# Patient Record
Sex: Female | Born: 1995 | Race: Black or African American | Hispanic: No | Marital: Single | Smoking: Never smoker
Health system: Southern US, Community
[De-identification: ages and names within clinical notes are randomized; demographics above are authoritative.]

## PROBLEM LIST (undated history)

## (undated) DIAGNOSIS — J45909 Unspecified asthma, uncomplicated: Secondary | ICD-10-CM

## (undated) DIAGNOSIS — K589 Irritable bowel syndrome without diarrhea: Secondary | ICD-10-CM

## (undated) HISTORY — PX: CHOLECYSTECTOMY: SHX55

---

## 2015-12-12 ENCOUNTER — Emergency Department (HOSPITAL_COMMUNITY)
Admission: EM | Admit: 2015-12-12 | Discharge: 2015-12-12 | Disposition: A | Discharge status: Home / Self Care | Payer: BLUE CROSS/BLUE SHIELD | Attending: Emergency Medicine | Admitting: Emergency Medicine

## 2015-12-12 ENCOUNTER — Emergency Department (HOSPITAL_COMMUNITY): Payer: BLUE CROSS/BLUE SHIELD

## 2015-12-12 ENCOUNTER — Encounter (HOSPITAL_COMMUNITY): Payer: Self-pay | Admitting: *Deleted

## 2015-12-12 DIAGNOSIS — J45901 Unspecified asthma with (acute) exacerbation: Secondary | ICD-10-CM | POA: Diagnosis not present

## 2015-12-12 DIAGNOSIS — Z3202 Encounter for pregnancy test, result negative: Secondary | ICD-10-CM | POA: Insufficient documentation

## 2015-12-12 DIAGNOSIS — R197 Diarrhea, unspecified: Secondary | ICD-10-CM | POA: Insufficient documentation

## 2015-12-12 DIAGNOSIS — R109 Unspecified abdominal pain: Secondary | ICD-10-CM | POA: Insufficient documentation

## 2015-12-12 DIAGNOSIS — R42 Dizziness and giddiness: Secondary | ICD-10-CM | POA: Insufficient documentation

## 2015-12-12 DIAGNOSIS — R6889 Other general symptoms and signs: Secondary | ICD-10-CM

## 2015-12-12 DIAGNOSIS — Z880 Allergy status to penicillin: Secondary | ICD-10-CM | POA: Diagnosis not present

## 2015-12-12 DIAGNOSIS — R112 Nausea with vomiting, unspecified: Secondary | ICD-10-CM | POA: Insufficient documentation

## 2015-12-12 HISTORY — DX: Unspecified asthma, uncomplicated: J45.909

## 2015-12-12 LAB — URINALYSIS, ROUTINE W REFLEX MICROSCOPIC
Bilirubin Urine: NEGATIVE
Glucose, UA: NEGATIVE mg/dL
Hgb urine dipstick: NEGATIVE
KETONES UR: NEGATIVE mg/dL
LEUKOCYTES UA: NEGATIVE
NITRITE: NEGATIVE
PH: 7 (ref 5.0–8.0)
Protein, ur: NEGATIVE mg/dL
Specific Gravity, Urine: 1.018 (ref 1.005–1.030)

## 2015-12-12 LAB — CBC
HEMATOCRIT: 42.2 % (ref 36.0–46.0)
HEMOGLOBIN: 13.6 g/dL (ref 12.0–15.0)
MCH: 27 pg (ref 26.0–34.0)
MCHC: 32.2 g/dL (ref 30.0–36.0)
MCV: 83.9 fL (ref 78.0–100.0)
Platelets: 326 10*3/uL (ref 150–400)
RBC: 5.03 MIL/uL (ref 3.87–5.11)
RDW: 15 % (ref 11.5–15.5)
WBC: 8.3 10*3/uL (ref 4.0–10.5)

## 2015-12-12 LAB — COMPREHENSIVE METABOLIC PANEL
ALT: 28 U/L (ref 14–54)
ANION GAP: 9 (ref 5–15)
AST: 27 U/L (ref 15–41)
Albumin: 4.1 g/dL (ref 3.5–5.0)
Alkaline Phosphatase: 77 U/L (ref 38–126)
BILIRUBIN TOTAL: 0.4 mg/dL (ref 0.3–1.2)
BUN: 8 mg/dL (ref 6–20)
CO2: 25 mmol/L (ref 22–32)
Calcium: 9.1 mg/dL (ref 8.9–10.3)
Chloride: 103 mmol/L (ref 101–111)
Creatinine, Ser: 0.78 mg/dL (ref 0.44–1.00)
GFR calc Af Amer: >60 mL/min (ref >60)
Glucose, Bld: 80 mg/dL (ref 65–99)
POTASSIUM: 4.2 mmol/L (ref 3.5–5.1)
Sodium: 137 mmol/L (ref 135–145)
TOTAL PROTEIN: 7.7 g/dL (ref 6.5–8.1)

## 2015-12-12 LAB — LIPASE, BLOOD: Lipase: 22 U/L (ref 11–51)

## 2015-12-12 LAB — PREGNANCY, URINE: PREG TEST UR: NEGATIVE

## 2015-12-12 MED ORDER — DIPHENOXYLATE-ATROPINE 2.5-0.025 MG PO TABS: 1.0000 | ORAL_TABLET | Freq: Four times a day (QID) | ORAL | Status: AC | PRN

## 2015-12-12 MED ORDER — DIPHENOXYLATE-ATROPINE 2.5-0.025 MG PO TABS
2.0000 | ORAL_TABLET | Freq: Once | ORAL | Status: AC
  Administered 2015-12-12: 2 via ORAL
  Filled 2015-12-12: qty 2

## 2015-12-12 MED ORDER — IBUPROFEN 200 MG PO TABS
400.0000 mg | ORAL_TABLET | Freq: Once | ORAL | Status: AC
  Administered 2015-12-12: 400 mg via ORAL
  Filled 2015-12-12: qty 2

## 2015-12-12 NOTE — ED Notes (Signed)
Pt tolerating PO intake. No complaints of nausea, abdominal pain or diarrhea. Pt states that she does feel her stomach "rumbling".

## 2015-12-12 NOTE — ED Notes (Signed)
Pt reports all week, she has been having body aches, chills, sore throat, and every times she eats she has diarrhea.  Pt reports she started to have vomiting today.

## 2015-12-13 NOTE — ED Provider Notes (Signed)
CSN: [id number]     Arrival date & time 12/12/15  1334 History   First MD Initiated Contact with Patient 12/12/15 1607     Chief Complaint  Patient presents with  . Generalized Body Aches  . Dizziness     (Consider location/radiation/quality/duration/timing/severity/associated sxs/prior Treatment) The history is provided by the patient.   Rachel Middleton is a 20 y.o. female with a past medical history significant for asthma, presenting with a 5 day history of persistent diarrhea, describing brown liquid stool triggered by any attempt at eating with 4 episodes occuring today.  She denies bloody stool and is able to tolerate sips of liquid intake without triggering diarrhea. She endorses nausea with one episode of vomiting this am along with body aches, chills and sore throat.  She reports cough with wheeze but has been controlled using her albuterol mdi, denies shortness of breath or coryza type symptoms. Denies any contacts with similar symptoms and no foreign travel.  She has abdominal cramping which improves with bm. Has had no treatment prior to arrival.    Past Medical History  Diagnosis Date  . Asthma    History reviewed. No pertinent past surgical history. No family history on file. Social History  Substance Use Topics  . Smoking status: Never Smoker   . Smokeless tobacco: None  . Alcohol Use: No   OB History    No data available     Review of Systems  Constitutional: Negative for fever and fatigue.  HENT: Negative for congestion and sore throat.   Eyes: Negative.   Respiratory: Negative for chest tightness and shortness of breath.   Cardiovascular: Negative for chest pain.  Gastrointestinal: Positive for nausea, vomiting, abdominal pain and diarrhea.  Genitourinary: Negative.   Musculoskeletal: Negative for joint swelling, arthralgias and neck pain.  Skin: Negative.  Negative for rash and wound.  Neurological: Negative for dizziness, weakness, light-headedness,  numbness and headaches.  Psychiatric/Behavioral: Negative.       Allergies  Penicillins  Home Medications   Prior to Admission medications   Medication Sig Start Date End Date Taking? Authorizing Provider  diphenoxylate-atropine (LOMOTIL) 2.5-0.025 MG tablet Take 1 tablet by mouth 4 (four) times daily as needed for diarrhea or loose stools. 12/12/15   Burgess Amor, PA-C   BP 115/72 mmHg  Pulse 69  Temp(Src) 98.2 F (36.8 C) (Oral)  Resp 15  SpO2 100% Physical Exam  Constitutional: She appears well-developed and well-nourished.  HENT:  Head: Normocephalic and atraumatic.  Eyes: Conjunctivae are normal.  Neck: Normal range of motion.  Cardiovascular: Normal rate, regular rhythm, normal heart sounds and intact distal pulses.   Pulmonary/Chest: Effort normal. She has wheezes in the right lower field and the left lower field.  Wheezing which clears with cough.  Abdominal: Soft. She exhibits no distension and no mass. Bowel sounds are increased. There is no tenderness. There is no rigidity, no rebound, no guarding, no tenderness at McBurney's point and negative Murphy's sign.  Musculoskeletal: Normal range of motion.  Neurological: She is alert.  Skin: Skin is warm and dry.  Psychiatric: She has a normal mood and affect.  Nursing note and vitals reviewed.   ED Course  Procedures (including critical care time) Labs Review Labs Reviewed  LIPASE, BLOOD  COMPREHENSIVE METABOLIC PANEL  CBC  URINALYSIS, ROUTINE W REFLEX MICROSCOPIC (NOT AT [facility])  PREGNANCY, URINE    Imaging Review Dg Chest 2 View  12/12/2015  CLINICAL DATA:  Productive cough and fever for 2 weeks. EXAM:  CHEST  2 VIEW COMPARISON:  None. FINDINGS: The cardiac silhouette, mediastinal and hilar contours are normal. There is mild peribronchial thickening which could suggest bronchitis. No infiltrates or effusions. The bony thorax is intact. IMPRESSION: Mild peribronchial thickening which could suggest bronchitis. No  infiltrates or effusions. Electronically Signed   By: Rudie MeyerP.  Gallerani M.D.   On: 12/12/2015 17:13   I have personally reviewed and evaluated these images and lab results as part of my medical decision-making.   EKG Interpretation None      MDM   Final diagnoses:  Flu-like symptoms  Diarrhea, unspecified type    Benign abdominal exam, initially and at recheck prior to dc home. Labs and imaging reviewed. Pt given lomotil while here, and was then able to tolerate po intake without triggering diarrhea. Prescribed same for prn use. B.r.a.t diet, continue increased fluid intake. Prn f/u anticipated. Labs unremarkable, no sign of dehydration or electrolyte imbalance from diarrhea. No respiratory distress. Advised continued albuterol prn. Referral given for obtaining pcp. Advised recheck here if sx persist or worsen.    Burgess AmorJulie Carrissa Taitano, PA-C 12/13/15 1212  Nelva Nayobert Beaton, MD 12/13/15 [phone]

## 2016-10-07 ENCOUNTER — Encounter (HOSPITAL_COMMUNITY): Payer: Self-pay | Admitting: *Deleted

## 2016-10-07 ENCOUNTER — Emergency Department (HOSPITAL_COMMUNITY)
Admission: EM | Admit: 2016-10-07 | Discharge: 2016-10-07 | Disposition: A | Discharge status: Home / Self Care | Payer: 59 | Attending: Emergency Medicine | Admitting: Emergency Medicine

## 2016-10-07 ENCOUNTER — Emergency Department (HOSPITAL_COMMUNITY): Admission: EM | Admit: 2016-10-07 | Discharge: 2016-10-07 | Discharge status: Against Medical Advice | Payer: 59

## 2016-10-07 DIAGNOSIS — Z79899 Other long term (current) drug therapy: Secondary | ICD-10-CM | POA: Insufficient documentation

## 2016-10-07 DIAGNOSIS — J029 Acute pharyngitis, unspecified: Secondary | ICD-10-CM | POA: Insufficient documentation

## 2016-10-07 DIAGNOSIS — J45909 Unspecified asthma, uncomplicated: Secondary | ICD-10-CM | POA: Insufficient documentation

## 2016-10-07 DIAGNOSIS — R05 Cough: Secondary | ICD-10-CM | POA: Insufficient documentation

## 2016-10-07 DIAGNOSIS — R69 Illness, unspecified: Secondary | ICD-10-CM

## 2016-10-07 DIAGNOSIS — J111 Influenza due to unidentified influenza virus with other respiratory manifestations: Secondary | ICD-10-CM

## 2016-10-07 DIAGNOSIS — R10813 Right lower quadrant abdominal tenderness: Secondary | ICD-10-CM | POA: Insufficient documentation

## 2016-10-07 HISTORY — DX: Irritable bowel syndrome, unspecified: K58.9

## 2016-10-07 LAB — POC URINE PREG, ED: PREG TEST UR: NEGATIVE

## 2016-10-07 LAB — URINALYSIS, ROUTINE W REFLEX MICROSCOPIC
BILIRUBIN URINE: NEGATIVE
Glucose, UA: NEGATIVE mg/dL
HGB URINE DIPSTICK: NEGATIVE
KETONES UR: NEGATIVE mg/dL
Leukocytes, UA: NEGATIVE
NITRITE: NEGATIVE
PROTEIN: NEGATIVE mg/dL
SPECIFIC GRAVITY, URINE: 1.012 (ref 1.005–1.030)
pH: 6 (ref 5.0–8.0)

## 2016-10-07 LAB — CBC WITH DIFFERENTIAL/PLATELET
Basophils Absolute: 0 10*3/uL (ref 0.0–0.1)
Basophils Relative: 0 %
EOS ABS: 0.2 10*3/uL (ref 0.0–0.7)
Eosinophils Relative: 3 %
HEMATOCRIT: 39.2 % (ref 36.0–46.0)
HEMOGLOBIN: 12.9 g/dL (ref 12.0–15.0)
LYMPHS ABS: 3.1 10*3/uL (ref 0.7–4.0)
LYMPHS PCT: 45 %
MCH: 27.1 pg (ref 26.0–34.0)
MCHC: 32.9 g/dL (ref 30.0–36.0)
MCV: 82.4 fL (ref 78.0–100.0)
MONOS PCT: 11 %
Monocytes Absolute: 0.7 10*3/uL (ref 0.1–1.0)
NEUTROS ABS: 2.8 10*3/uL (ref 1.7–7.7)
NEUTROS PCT: 41 %
Platelets: 291 10*3/uL (ref 150–400)
RBC: 4.76 MIL/uL (ref 3.87–5.11)
RDW: 15 % (ref 11.5–15.5)
WBC: 6.9 10*3/uL (ref 4.0–10.5)

## 2016-10-07 LAB — COMPREHENSIVE METABOLIC PANEL
ALT: 19 U/L (ref 14–54)
ANION GAP: 9 (ref 5–15)
AST: 24 U/L (ref 15–41)
Albumin: 3.8 g/dL (ref 3.5–5.0)
Alkaline Phosphatase: 56 U/L (ref 38–126)
BUN: 7 mg/dL (ref 6–20)
CHLORIDE: 104 mmol/L (ref 101–111)
CO2: 25 mmol/L (ref 22–32)
CREATININE: 0.55 mg/dL (ref 0.44–1.00)
Calcium: 9 mg/dL (ref 8.9–10.3)
Glucose, Bld: 87 mg/dL (ref 65–99)
POTASSIUM: 3.5 mmol/L (ref 3.5–5.1)
SODIUM: 138 mmol/L (ref 135–145)
Total Bilirubin: 0.8 mg/dL (ref 0.3–1.2)
Total Protein: 7.1 g/dL (ref 6.5–8.1)

## 2016-10-07 LAB — RAPID STREP SCREEN (MED CTR MEBANE ONLY): STREPTOCOCCUS, GROUP A SCREEN (DIRECT): NEGATIVE

## 2016-10-07 MED ORDER — SODIUM CHLORIDE 0.9 % IV BOLUS (SEPSIS)
1000.0000 mL | Freq: Once | INTRAVENOUS | Status: AC
  Administered 2016-10-07: 1000 mL via INTRAVENOUS

## 2016-10-07 MED ORDER — ONDANSETRON 4 MG PO TBDP: 4.0000 mg | ORAL_TABLET | Freq: Three times a day (TID) | ORAL | 0 refills | Status: AC | PRN

## 2016-10-07 NOTE — ED Triage Notes (Signed)
Patient is alert and oriented x4.  She is complaining of flu like symptoms that started 2 days ago.  Patient state that she is not able to keep any food or liquids down.  Currently she rates her pain 6 of 10.

## 2016-10-07 NOTE — ED Notes (Signed)
PT DISCHARGED. INSTRUCTIONS AND PRESCRIPTIONS GIVEN. AAOX4. PT IN NO APPARENT DISTRESS OR PAIN. THE OPPORTUNITY TO ASK QUESTIONS WAS PROVIDED. 

## 2016-10-07 NOTE — ED Notes (Signed)
Pt left after being registered

## 2016-10-07 NOTE — ED Provider Notes (Signed)
[facility]-EMERGENCY DEPT Provider Note   CSN: [id number] Arrival date & time: 10/07/16  [id number]     History   Chief Complaint Chief Complaint  Patient presents with  . Influenza    HPI Rachel Middleton is a 20 y.o. female.  The history is provided by the patient. No language interpreter was used.  Influenza    Rachel Middleton is a 20 y.o. female who presents to the Emergency Department complaining of flu like symptoms.  She reports 3 days of generalized body aches with associated sore throat, mild cough, vomiting, diarrhea. She has associated mild abdominal discomfort and cramping. No dysuria. She reports sick contacts at work with similar symptoms. She can to be seen in the emergency department last night but left due to long wait. She reports generalized fatigue and weakness.  Past Medical History:  Diagnosis Date  . Asthma   . IBS (irritable bowel syndrome)     There are no active problems to display for this patient.   History reviewed. No pertinent surgical history.  OB History    No data available       Home Medications    Prior to Admission medications   Medication Sig Start Date End Date Taking? Authorizing Provider  colestipol (COLESTID) 1 g tablet Take 1 g by mouth 2 (two) times daily. 03/31/16 03/31/17 Yes Historical Provider, MD  diphenoxylate-atropine (LOMOTIL) 2.5-0.025 MG tablet Take 1 tablet by mouth 4 (four) times daily as needed for diarrhea or loose stools. 12/12/15  Yes Raynelle FanningJulie Idol, PA-C  ondansetron (ZOFRAN ODT) 4 MG disintegrating tablet Take 1 tablet (4 mg total) by mouth every 8 (eight) hours as needed for nausea or vomiting. 10/07/16   Tilden FossaElizabeth Shawonda Kerce, MD    Family History No family history on file.  Social History Social History  Substance Use Topics  . Smoking status: Never Smoker  . Smokeless tobacco: Not on file  . Alcohol use No     Allergies   Penicillins   Review of Systems Review of Systems  All other systems reviewed and are  negative.    Physical Exam Updated Vital Signs BP 116/76 (BP Location: Left Arm)   Pulse 72   Temp 98.2 F (36.8 C) (Oral)   Resp 18   Ht 5\' 7"  (1.702 m)   Wt 152 lb (68.9 kg)   SpO2 100%   BMI 23.81 kg/m   Physical Exam  Constitutional: She is oriented to person, place, and time. She appears well-developed and well-nourished.  HENT:  Head: Normocephalic and atraumatic.  Mild erythema in the posterior oropharynx without exudates  Cardiovascular: Normal rate and regular rhythm.   No murmur heard. Pulmonary/Chest: Effort normal and breath sounds normal. No respiratory distress.  Abdominal: Soft. There is tenderness. There is no rebound and no guarding.  Mild right lower quadrant tenderness  Musculoskeletal: She exhibits no edema or tenderness.  Neurological: She is alert and oriented to person, place, and time.  Skin: Skin is warm and dry.  Psychiatric: She has a normal mood and affect. Her behavior is normal.  Nursing note and vitals reviewed.    ED Treatments / Results  Labs (all labs ordered are listed, but only abnormal results are displayed) Labs Reviewed  RAPID STREP SCREEN (NOT AT [facility])  CULTURE, GROUP A STREP ([facility])  URINALYSIS, ROUTINE W REFLEX MICROSCOPIC  COMPREHENSIVE METABOLIC PANEL  CBC WITH DIFFERENTIAL/PLATELET  POC URINE PREG, ED    EKG  EKG Interpretation None       Radiology  No results found.  Procedures Procedures (including critical care time)  Medications Ordered in ED Medications  sodium chloride 0.9 % bolus 1,000 mL (0 mLs Intravenous Stopped 10/07/16 1406)     Initial Impression / Assessment and Plan / ED Course  I have reviewed the triage vital signs and the nursing notes.  Pertinent labs & imaging results that were available during my care of the patient were reviewed by me and considered in my medical decision making (see chart for details).  Clinical Course     Patient here with multiple complaints. She is nontoxic  appearing on examination with no evidence of serious bacterial infection. Discussed the patient home care for likely flulike illness with outpatient follow-up and return precautions. Patient is not a Tamiflu candidate given duration of her symptoms.  Final Clinical Impressions(s) / ED Diagnoses   Final diagnoses:  Influenza-like illness    New Prescriptions Discharge Medication List as of 10/07/2016  1:28 PM    START taking these medications   Details  ondansetron (ZOFRAN ODT) 4 MG disintegrating tablet Take 1 tablet (4 mg total) by mouth every 8 (eight) hours as needed for nausea or vomiting., Starting Sat 10/07/2016, Print         Tilden FossaElizabeth Henrick Mcgue, MD 10/10/16 [phone]

## 2016-10-09 LAB — CULTURE, GROUP A STREP (THRC)

## 2017-12-28 ENCOUNTER — Emergency Department (HOSPITAL_COMMUNITY): Payer: Managed Care, Other (non HMO)

## 2017-12-28 ENCOUNTER — Emergency Department (HOSPITAL_COMMUNITY)
Admission: EM | Admit: 2017-12-28 | Discharge: 2017-12-28 | Disposition: A | Discharge status: Home / Self Care | Payer: Managed Care, Other (non HMO) | Attending: Emergency Medicine | Admitting: Emergency Medicine

## 2017-12-28 ENCOUNTER — Encounter (HOSPITAL_COMMUNITY): Payer: Self-pay | Admitting: Emergency Medicine

## 2017-12-28 ENCOUNTER — Other Ambulatory Visit: Payer: Self-pay

## 2017-12-28 DIAGNOSIS — Z79899 Other long term (current) drug therapy: Secondary | ICD-10-CM | POA: Insufficient documentation

## 2017-12-28 DIAGNOSIS — J45909 Unspecified asthma, uncomplicated: Secondary | ICD-10-CM | POA: Insufficient documentation

## 2017-12-28 DIAGNOSIS — K8020 Calculus of gallbladder without cholecystitis without obstruction: Secondary | ICD-10-CM

## 2017-12-28 DIAGNOSIS — N1330 Unspecified hydronephrosis: Secondary | ICD-10-CM | POA: Diagnosis not present

## 2017-12-28 DIAGNOSIS — R1013 Epigastric pain: Secondary | ICD-10-CM | POA: Diagnosis present

## 2017-12-28 DIAGNOSIS — R1011 Right upper quadrant pain: Secondary | ICD-10-CM

## 2017-12-28 LAB — BASIC METABOLIC PANEL
ANION GAP: 9 (ref 5–15)
BUN: 9 mg/dL (ref 6–20)
CO2: 28 mmol/L (ref 22–32)
Calcium: 8.7 mg/dL — ABNORMAL LOW (ref 8.9–10.3)
Chloride: 99 mmol/L — ABNORMAL LOW (ref 101–111)
Creatinine, Ser: 0.8 mg/dL (ref 0.44–1.00)
GFR calc non Af Amer: >60 mL/min (ref >60)
Glucose, Bld: 98 mg/dL (ref 65–99)
POTASSIUM: 3.5 mmol/L (ref 3.5–5.1)
SODIUM: 136 mmol/L (ref 135–145)

## 2017-12-28 LAB — HEPATIC FUNCTION PANEL
ALT: 21 U/L (ref 14–54)
AST: 28 U/L (ref 15–41)
Albumin: 4.1 g/dL (ref 3.5–5.0)
Alkaline Phosphatase: 59 U/L (ref 38–126)
BILIRUBIN DIRECT: 0.1 mg/dL (ref 0.1–0.5)
BILIRUBIN INDIRECT: 0.5 mg/dL (ref 0.3–0.9)
BILIRUBIN TOTAL: 0.6 mg/dL (ref 0.3–1.2)
Total Protein: 7.5 g/dL (ref 6.5–8.1)

## 2017-12-28 LAB — URINALYSIS, ROUTINE W REFLEX MICROSCOPIC
BILIRUBIN URINE: NEGATIVE
GLUCOSE, UA: NEGATIVE mg/dL
Ketones, ur: NEGATIVE mg/dL
NITRITE: NEGATIVE
PROTEIN: NEGATIVE mg/dL
Specific Gravity, Urine: 1.01 (ref 1.005–1.030)
pH: 7 (ref 5.0–8.0)

## 2017-12-28 LAB — CBC WITH DIFFERENTIAL/PLATELET
BASOS PCT: 1 %
Basophils Absolute: 0.1 10*3/uL (ref 0.0–0.1)
EOS ABS: 0.1 10*3/uL (ref 0.0–0.7)
Eosinophils Relative: 2 %
HEMATOCRIT: 39.4 % (ref 36.0–46.0)
Hemoglobin: 12.8 g/dL (ref 12.0–15.0)
Lymphocytes Relative: 38 %
Lymphs Abs: 2.5 10*3/uL (ref 0.7–4.0)
MCH: 28.6 pg (ref 26.0–34.0)
MCHC: 32.5 g/dL (ref 30.0–36.0)
MCV: 88.1 fL (ref 78.0–100.0)
Monocytes Absolute: 0.6 10*3/uL (ref 0.1–1.0)
Monocytes Relative: 9 %
NEUTROS ABS: 3.4 10*3/uL (ref 1.7–7.7)
NEUTROS PCT: 50 %
Platelets: 233 10*3/uL (ref 150–400)
RBC: 4.47 MIL/uL (ref 3.87–5.11)
RDW: 14.1 % (ref 11.5–15.5)
WBC: 6.7 10*3/uL (ref 4.0–10.5)

## 2017-12-28 LAB — LIPASE, BLOOD: Lipase: 25 U/L (ref 11–51)

## 2017-12-28 LAB — PREGNANCY, URINE: PREG TEST UR: NEGATIVE

## 2017-12-28 MED ORDER — ONDANSETRON HCL 4 MG/2ML IJ SOLN
4.0000 mg | Freq: Once | INTRAMUSCULAR | Status: AC
  Administered 2017-12-28: 4 mg via INTRAVENOUS
  Filled 2017-12-28: qty 2

## 2017-12-28 MED ORDER — KETOROLAC TROMETHAMINE 30 MG/ML IJ SOLN
30.0000 mg | Freq: Once | INTRAMUSCULAR | Status: AC
  Administered 2017-12-28: 30 mg via INTRAVENOUS
  Filled 2017-12-28: qty 1

## 2017-12-28 MED ORDER — MORPHINE SULFATE (PF) 4 MG/ML IV SOLN
4.0000 mg | Freq: Once | INTRAVENOUS | Status: AC
  Administered 2017-12-28: 4 mg via INTRAVENOUS
  Filled 2017-12-28: qty 1

## 2017-12-28 MED ORDER — DICYCLOMINE HCL 20 MG PO TABS: 20.0000 mg | ORAL_TABLET | Freq: Two times a day (BID) | ORAL | 0 refills | Status: AC

## 2017-12-28 MED ORDER — HYDROCODONE-ACETAMINOPHEN 5-325 MG PO TABS: 1.0000 | ORAL_TABLET | Freq: Four times a day (QID) | ORAL | 0 refills | Status: AC | PRN

## 2017-12-28 MED ORDER — SODIUM CHLORIDE 0.9 % IV BOLUS (SEPSIS)
1000.0000 mL | Freq: Once | INTRAVENOUS | Status: AC
  Administered 2017-12-28: 1000 mL via INTRAVENOUS

## 2017-12-28 NOTE — Discharge Instructions (Addendum)
Please see the information and instructions below regarding your visit.  Your diagnoses today include:  1. Hydronephrosis, unspecified hydronephrosis type   2. Calculus of gallbladder without cholecystitis without obstruction   3. RUQ pain    Your testing today is showing that you have some backing up of urine into the right kidney, and this is likely due to the way your kidneys are structured.  No stone is identified.  Please follow-up with urology regarding this finding, as well as the finding of blood in your urine when you are not on your period.  You were also found to have gallstones.  This can cause intermittent severe sharp pain.  Please follow-up with the general surgeon listed in your paperwork regarding this finding.  Some people find that eating fatty foods can trigger an episode of this pain.  Please make a food diary of the foods that cause this, so you know to avoid them.  Please see the instructional paperwork in this packet.  Tests performed today include: See side panel of your discharge paperwork for testing performed today. Vital signs are listed at the bottom of these instructions.   Medications prescribed:    Take any prescribed medications only as prescribed, and any over the counter medications only as directed on the packaging.  You have been prescribed Norco for pain. This is an opioid pain medication. You may take this medication every 4-6 hours as needed for pain. Only take this medication if you need it for breakthrough pain. You may combine this medicine with ibuprofen, a non-steroidal anti-inflammatory drug (NSAID) every 6 hours, so you are getting something for pain relief every 3 hours.  Do not combine this medication with Tylenol, as it may increase the risk of liver problems.  Do not combine this medication with alcohol.  Please be advised to avoid driving or operating heavy machinery while taking this medication, as it may make you drowsy or impair  judgment.   Bentyl.  This is an antispasmodic agent.  Home care instructions:  Please follow any educational materials contained in this packet.   Follow-up instructions: Please follow-up with your primary care provider as soon as possible for further evaluation of your symptoms if they are not completely improved.   Please follow up with Urology and General Surgery.  Return instructions:  Please return to the Emergency Department if you experience worsening symptoms.  Please return to the emergency department for any worsening pain, nausea or vomiting that prevents you from keeping anything down, fevers with abdominal pain, or abdominal pain that is migrating to other areas of the abdomen like the right lower quadrant. Please return if you have any other emergent concerns.  Additional Information:  To find a primary care or specialty doctor please call [phone] or [phone] to access "[facility] Find a Doctor Service."  You may also go on the [facility] website at [email or web address]  There are also multiple [facility] and [facility] practices throughout the [place] that are frequently accepting new patients. You may find a clinic that is close to your home and contact them.  [facility] - [address] [place] [phone]  [facility] in [place] (also locations in [place] and [place]) - [address] [place] [phone]  [facility] - [address] [phone]    Your vital signs today were: BP (!) 95/44 (BP Location: Left Arm)    Pulse 61  Temp 97.8 F (36.6 C) (Oral)    Resp 16    LMP 12/24/2017 Comment: neg preg test 12/28/2017   SpO2 100%  If your blood pressure (BP) was elevated on multiple readings during this visit above 130 for the top number or above 80 for the bottom number, please have this repeated by your  primary care provider within one month. --------------  Thank you for allowing us to participate in your care today.

## 2017-12-28 NOTE — ED Provider Notes (Signed)
  Physical Exam  BP (!) 95/44 (BP Location: Left Arm)   Pulse 61   Temp 97.8 F (36.6 C) (Oral)   Resp 16   LMP 12/24/2017   SpO2 100%   Assumed care from Antony MaduraKelly Humes, PA-C at 7:28 AM. Briefly, the patient is a 22 y.o. female with PMHx of  has a past medical history of Asthma and IBS (irritable bowel syndrome). here with right-sided abdominal pain.  Pain woke her up from sleep last night.    Abdominal ultrasound noted a right-sided hydronephrosis.  Urinalysis pending assessing for hematuria, which likely signifies nephrolithiasis.  Labs Reviewed  BASIC METABOLIC PANEL - Abnormal; Notable for the following components:      Result Value   Chloride 99 (*)    Calcium 8.7 (*)    All other components within normal limits  URINALYSIS, ROUTINE W REFLEX MICROSCOPIC - Abnormal; Notable for the following components:   Hgb urine dipstick MODERATE (*)    Leukocytes, UA SMALL (*)    Bacteria, UA RARE (*)    Squamous Epithelial / LPF 0-5 (*)    All other components within normal limits  CBC WITH DIFFERENTIAL/PLATELET  LIPASE, BLOOD  HEPATIC FUNCTION PANEL  PREGNANCY, URINE    Course of Care:   Physical Exam  Constitutional: She appears well-developed and well-nourished. No distress.  Sitting comfortably in bed.  HENT:  Head: Normocephalic and atraumatic.  Eyes: Conjunctivae are normal. Right eye exhibits no discharge. Left eye exhibits no discharge.  EOMs normal to gross examination.  Neck: Normal range of motion.  Cardiovascular: Normal rate and regular rhythm.  Intact, 2+ radial pulse.  Pulmonary/Chest:  Normal respiratory effort. Patient converses comfortably. No audible wheeze or stridor.  Abdominal: Soft. She exhibits no distension. There is tenderness in the right upper quadrant and epigastric area. There is no rebound, no guarding and no CVA tenderness.  Musculoskeletal: Normal range of motion.  Neurological: She is alert.  Cranial nerves intact to gross observation. Patient  moves extremities without difficulty.  Skin: Skin is warm and dry. She is not diaphoretic.  Psychiatric: She has a normal mood and affect. Her behavior is normal. Judgment and thought content normal.  Nursing note and vitals reviewed.   ED Course/Procedures     Procedures  MDM  Per discussion with Antony MaduraKelly Humes, PA-C, if urinalysis significant for hematuria, patient can be treated symptomatically for nephrolithiasis.  7:47 AM Engaged in shared decision making with patient regarding imaging and would like to proceed with a CT scan at this time.  Patient exhibits some mild epigastric tenderness, but abdomen is otherwise benign.  9:06 AM CT renal stone study noted to have hydronephrosis and proximal hydroureter on the right, without distal dilatation of the right ureter.  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  No stone identified.  Gallbladder distended consistent with ultrasound but without biliary ductal dilatation.  Hepatic function panel is within normal limits.  Unclear source of hematuria, as patient is not menstruating, but will have patient follow-up for a repeat urinalysis when she follows up with urology.  9:22 AM Patient and return precautions to return for any high-grade abdominal pain, and abdominal pain with fever chills, or intractable nausea vomiting.  Patient is in understanding of her follow-up instructions and agrees with the plan of care.     Elisha PonderMurray, Beverlee Wilmarth B, PA-C 12/28/17 [id number]    Benjiman CorePickering, Nathan, MD 12/28/17 [phone]

## 2017-12-28 NOTE — ED Triage Notes (Signed)
Pt arriving POV with complaint of severe sharp abdominal pain that radiates towards her back. Pt also having N/V/D. Symptoms present for approx 2.5 hours.

## 2017-12-28 NOTE — ED Provider Notes (Signed)
[facility]-EMERGENCY DEPT Provider Note   CSN: [id number] Arrival date & time: 12/28/17  0359     History   Chief Complaint Chief Complaint  Patient presents with  . Abdominal Pain    HPI Rachel Middleton is a 22 y.o. female.  22 year old female with a history of asthma and irritable bowel syndrome presents to the emergency department for severe, sharp pain which woke her from sleep.  Pain is located in her epigastrium and right upper quadrant and radiates towards her back.  She has had nausea and multiple episodes of vomiting.  She tried taking Tylenol and drinking water, but vomited shortly after.  She has had some bowel movements which have been loose, but she would not classify them as diarrhea.  She denies any history of abdominal surgeries as well as any urinary symptoms, fevers.  Pain has been constant since onset 3 hours ago.  The history is provided by the patient. No language interpreter was used.  Abdominal Pain      Past Medical History:  Diagnosis Date  . Asthma   . IBS (irritable bowel syndrome)     There are no active problems to display for this patient.   History reviewed. No pertinent surgical history.  OB History   None      Home Medications    Prior to Admission medications   Medication Sig Start Date End Date Taking? Authorizing Provider  acetaminophen (TYLENOL) 500 MG tablet Take 1,000 mg by mouth every 6 (six) hours as needed for moderate pain.   Yes [provider]  diphenoxylate-atropine (LOMOTIL) 2.5-0.025 MG tablet Take 1 tablet by mouth 4 (four) times daily as needed for diarrhea or loose stools. Patient not taking: Reported on 12/28/2017 12/12/15   Burgess Amor, PA-C  ondansetron (ZOFRAN ODT) 4 MG disintegrating tablet Take 1 tablet (4 mg total) by mouth every 8 (eight) hours as needed for nausea or vomiting. Patient not taking: Reported on 12/28/2017 10/07/16   Tilden Fossa, MD    Family History No family  history on file.  Social History Social History   Tobacco Use  . Smoking status: Never Smoker  Substance Use Topics  . Alcohol use: No  . Drug use: No     Allergies   Penicillins   Review of Systems Review of Systems  Gastrointestinal: Positive for abdominal pain.   Ten systems reviewed and are negative for acute change, except as noted in the HPI.    Physical Exam Updated Vital Signs BP (!) 95/44 (BP Location: Left Arm)   Pulse 61   Temp 97.8 F (36.6 C) (Oral)   Resp 16   LMP 12/24/2017   SpO2 100%   Physical Exam  Constitutional: She is oriented to person, place, and time. She appears well-developed and well-nourished. No distress.  Patient nontoxic, but appears uncomfortable.  HENT:  Head: Normocephalic and atraumatic.  Eyes: Conjunctivae and EOM are normal. No scleral icterus.  Neck: Normal range of motion.  Cardiovascular: Normal rate, regular rhythm and intact distal pulses.  Pulmonary/Chest: Effort normal. No stridor. No respiratory distress. She has no wheezes.  Lungs clear to auscultation bilaterally  Abdominal:  Mild epigastric and right upper quadrant pain; negative Murphy sign.  No abdominal distention, palpable masses, peritoneal signs.  Musculoskeletal: Normal range of motion.  Neurological: She is alert and oriented to person, place, and time. She exhibits normal muscle tone. Coordination normal.  Skin: Skin is warm and dry. No rash noted. She is not  diaphoretic. No erythema. No pallor.  Psychiatric: She has a normal mood and affect. Her behavior is normal.  Nursing note and vitals reviewed.    ED Treatments / Results  Labs (all labs ordered are listed, but only abnormal results are displayed) Labs Reviewed  BASIC METABOLIC PANEL - Abnormal; Notable for the following components:      Result Value   Chloride 99 (*)    Calcium 8.7 (*)    All other components within normal limits  CBC WITH DIFFERENTIAL/PLATELET  LIPASE, BLOOD  HEPATIC  FUNCTION PANEL  URINALYSIS, ROUTINE W REFLEX MICROSCOPIC  PREGNANCY, URINE    EKG  EKG Interpretation None       Radiology US Abdomen Complete  Result Date: 12/28/2017 CLINICAL DATA:  Initial evaluation for acute abdominal pain. EXAM: ABDOMEN ULTRASOUND COMPLETE COMPARISON:  None. FINDINGS: Gallbladder: Stones and sludge seen within the gallbladder lumen, with the largest stone measuring 1.5 cm. Gallbladder wall measure within normal limits at 2.2 mm. No free pericholecystic fluid. No sonographic Murphy sign elicited on exam. Common bile duct: Diameter: 4.2 mm Liver: No focal lesion identified. Within normal limits in parenchymal echogenicity. Portal vein is patent on color Doppler imaging with normal direction of blood flow towards the liver. IVC: No abnormality visualized. Pancreas: Visualized portion unremarkable. Spleen: Size and appearance within normal limits. Right Kidney: Length: 12.1 cm. Echogenicity within normal limits. No mass lesion. There is moderate to severe right-sided hydronephrosis with dilatation of the proximal right ureter. Left Kidney: Length: 10.8 cm. Echogenicity within normal limits. No mass or hydronephrosis visualized. Abdominal aorta: No aneurysm visualized. Other findings: None. IMPRESSION: 1. Moderate to severe right-sided hydronephrosis. Clinical correlation for obstructive stone recommended. Additionally, finding could be further assessed with dedicated cross-sectional imaging as warranted. 2. Stones and sludge within the gallbladder lumen. No imaging findings to suggest acute cholecystitis. No biliary dilatation. Electronically Signed   By: Rise Mu M.D.   On: 12/28/2017 06:55    Procedures Procedures (including critical care time)  Medications Ordered in ED Medications  sodium chloride 0.9 % bolus 1,000 mL (0 mLs Intravenous Stopped 12/28/17 0643)  morphine 4 MG/ML injection 4 mg (4 mg Intravenous Given 12/28/17 0507)  ondansetron (ZOFRAN)  injection 4 mg (4 mg Intravenous Given 12/28/17 0507)     Initial Impression / Assessment and Plan / ED Course  I have reviewed the triage vital signs and the nursing notes.  Pertinent labs & imaging results that were available during my care of the patient were reviewed by me and considered in my medical decision making (see chart for details).     Patient presenting for right sided abdominal pain with radiation to the back. Symptoms awoke the patient from sleep with associated nausea and vomiting. No hx of fever. Tenderness noted in the RUQ on exam with negative Murphy's sign.   Labs reassuring; no leukocytosis or electrolyte derangements. Liver and kidney function preserved. An [place] was performed which shows stones and sludge in the gallbladder lumen but no evidence of cholecystitis. It is more likely that pain is 2/2 presence of moderate to severe right sided hydronephrosis.  Pending urinalysis at this time. If hematuria present, stone disease suspected. If hematuria absent, may warrant further work up with noncontrasted CT. Plan discussed with patient who verbalizes understanding. She reports significant improvement in symptoms and pain following repeat assessment.  Patient signed out to Aviva Kluver, PA-C at shift change who will assume care and disposition appropriately.   Final Clinical Impressions(s) / ED  Diagnoses   Final diagnoses:  Hydronephrosis, unspecified hydronephrosis type    ED Discharge Orders    None       Antony MaduraHumes, Mckinzee Spirito, PA-C 12/28/17 0727    Paula LibraMolpus, John, MD 12/28/17 2235

## 2017-12-28 NOTE — ED Notes (Signed)
Pt transported to CT ?

## 2018-04-26 ENCOUNTER — Encounter (HOSPITAL_COMMUNITY): Payer: Self-pay | Admitting: Emergency Medicine

## 2018-04-26 ENCOUNTER — Emergency Department (HOSPITAL_COMMUNITY): Payer: Managed Care, Other (non HMO)

## 2018-04-26 ENCOUNTER — Emergency Department (HOSPITAL_COMMUNITY)
Admission: EM | Admit: 2018-04-26 | Discharge: 2018-04-27 | Disposition: A | Discharge status: Home / Self Care | Payer: Managed Care, Other (non HMO) | Attending: Emergency Medicine | Admitting: Emergency Medicine

## 2018-04-26 DIAGNOSIS — Z9049 Acquired absence of other specified parts of digestive tract: Secondary | ICD-10-CM | POA: Diagnosis not present

## 2018-04-26 DIAGNOSIS — J45909 Unspecified asthma, uncomplicated: Secondary | ICD-10-CM | POA: Insufficient documentation

## 2018-04-26 DIAGNOSIS — M79605 Pain in left leg: Secondary | ICD-10-CM | POA: Insufficient documentation

## 2018-04-26 MED ORDER — ACETAMINOPHEN 325 MG PO TABS
650.0000 mg | ORAL_TABLET | Freq: Once | ORAL | Status: AC
  Administered 2018-04-26: 650 mg via ORAL
  Filled 2018-04-26: qty 2
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
## 2018-04-27 ENCOUNTER — Emergency Department (HOSPITAL_COMMUNITY): Payer: Managed Care, Other (non HMO)

## 2018-04-27 NOTE — Discharge Instructions (Signed)

## 2018-04-27 NOTE — ED Notes (Signed)
Pt verbalizes understanding of d/c instructions. Pt received prescriptions. Pt taken to lobby in wheelchair at d/c with all belongings and with family.   

## 2018-04-27 NOTE — ED Notes (Signed)
See EDP assessment 

## 2020-01-24 IMAGING — CT CT RENAL STONE PROTOCOL
2 of 4 series · 16 of 46 positions shown, 18 images · non-contrast
Comparison: 12/28/17

CLINICAL DATA: Sharp abdominal pain and hydronephrosis on recent
ultrasound

EXAM:
CT ABDOMEN AND PELVIS WITHOUT CONTRAST
TECHNIQUE: Multidetector CT imaging of the abdomen and pelvis was performed
following the standard protocol without IV contrast.

[Series 2: axial st · axial · 0.59mm/px · z∈[+1049,+1429]mm · 13 of 86 slices shown, 15 images]
[im 5/86  soft-tissue]
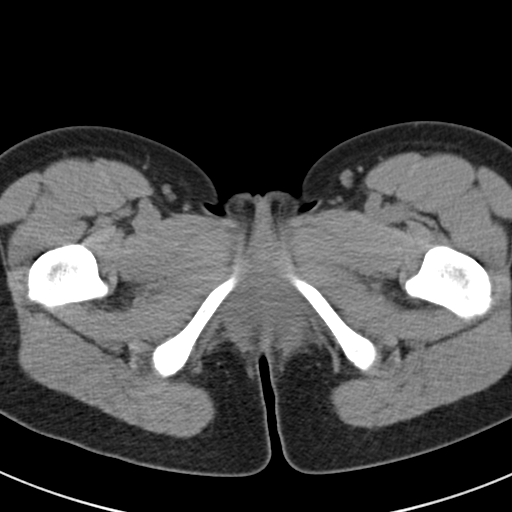
[im 5/86  bone]
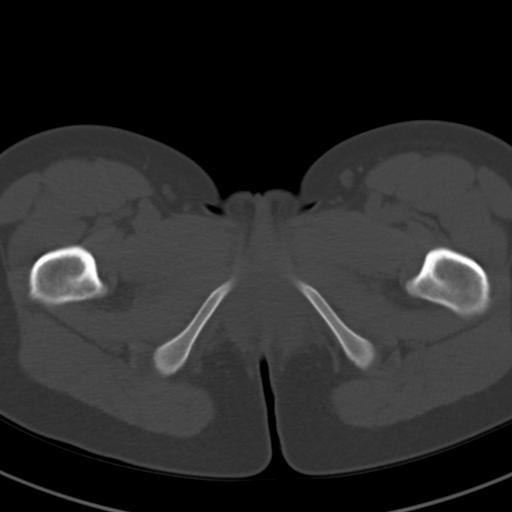
[im 13/86  soft-tissue]
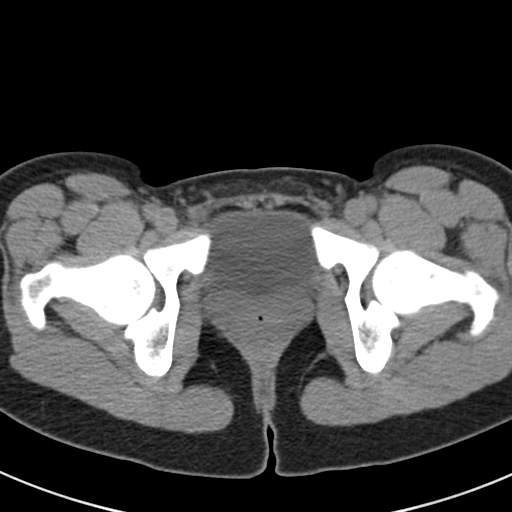
[im 17/86  soft-tissue]
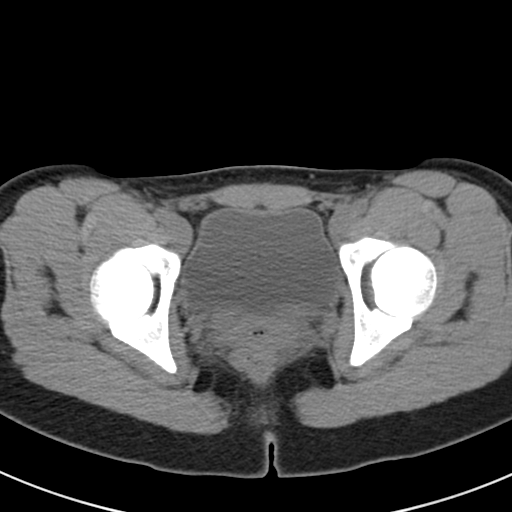
[im 25/86  soft-tissue]
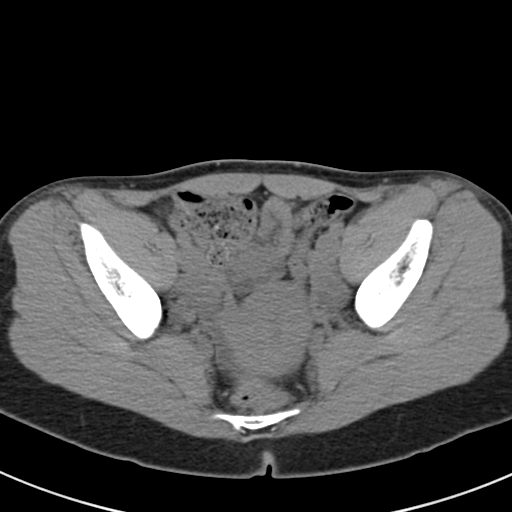
[im 29/86  soft-tissue]
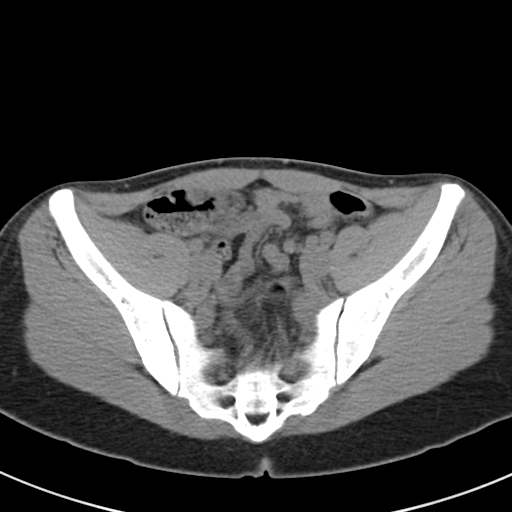
[im 37/86  soft-tissue]
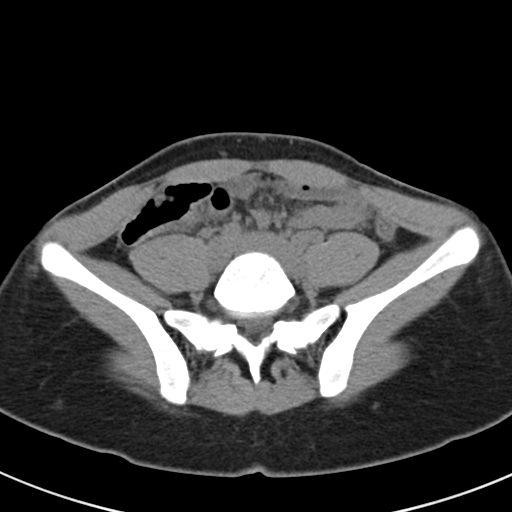
[im 45/86  soft-tissue]
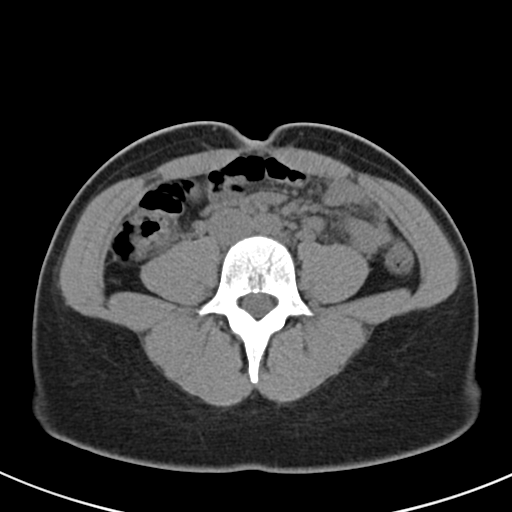
[im 49/86  soft-tissue]
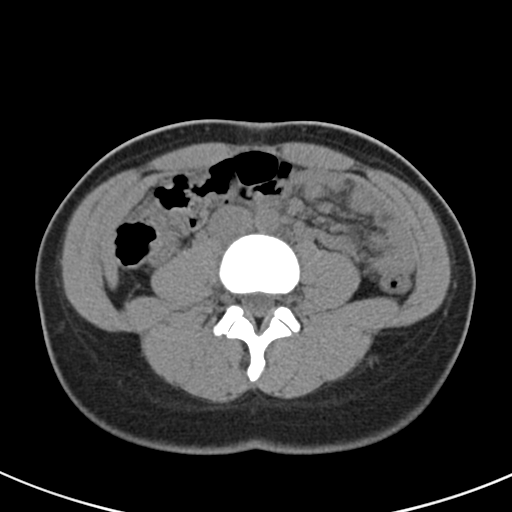
[im 57/86  soft-tissue]
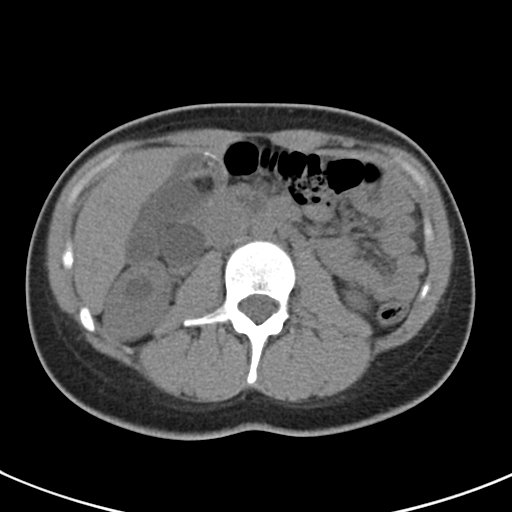
[im 57/86  bone]
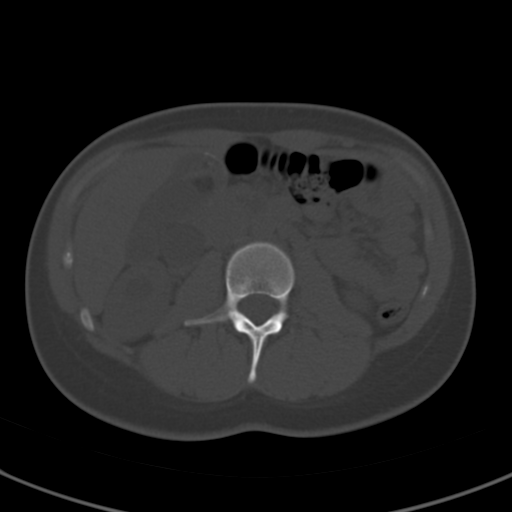
[im 61/86  soft-tissue]
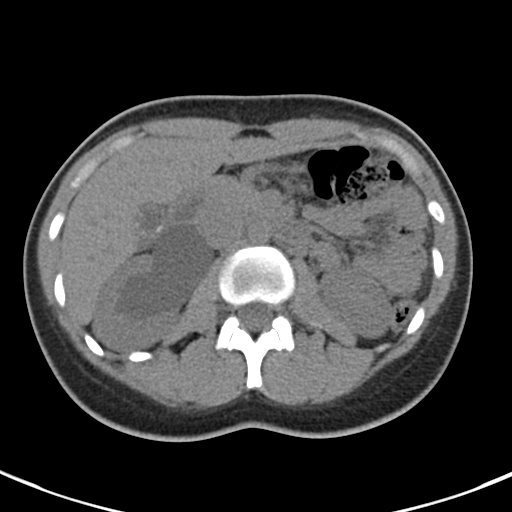
[im 69/86  soft-tissue]
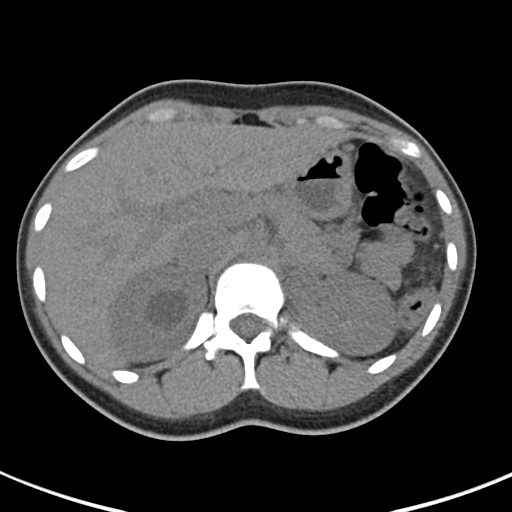
[im 73/86  soft-tissue]
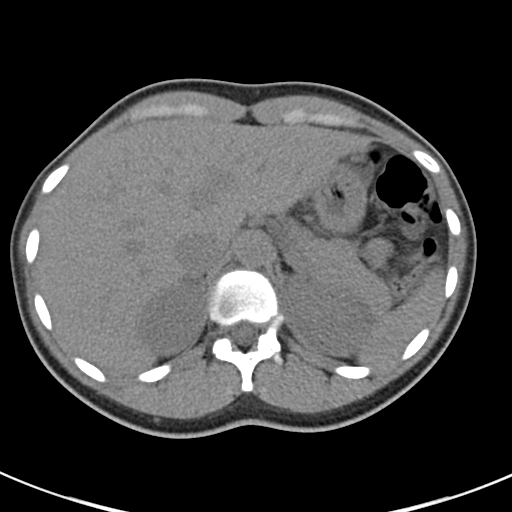
[im 81/86  soft-tissue]
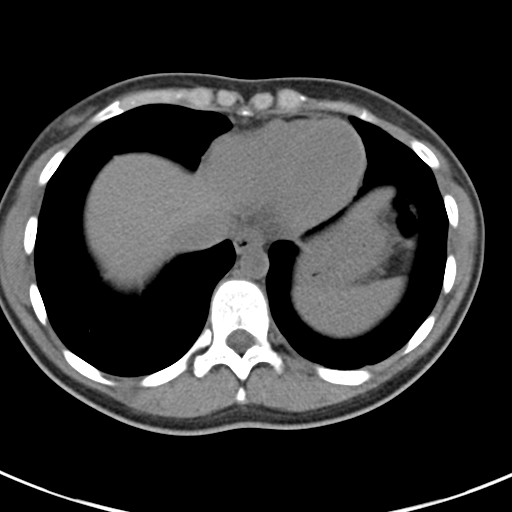

[Series 4: coronal · coronal · 0.71mm/px · 3 of 95 slices shown]
[im 32/95  soft-tissue]
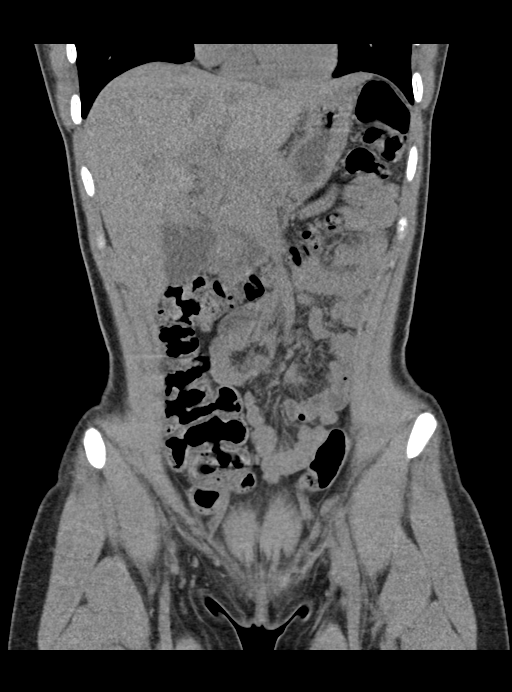
[im 42/95  soft-tissue]
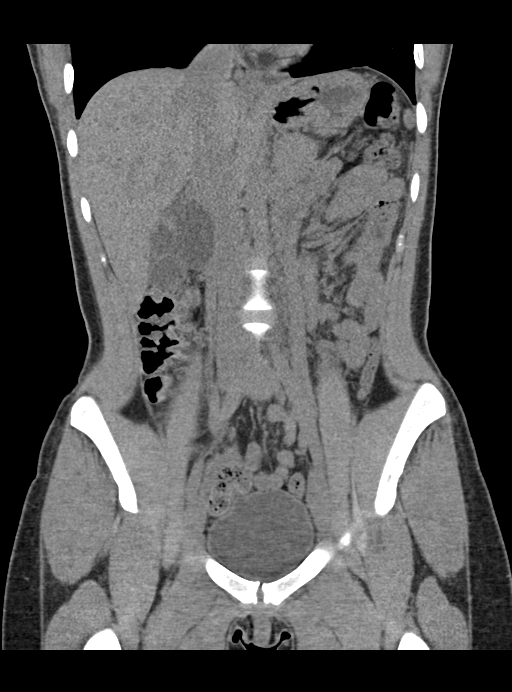
[im 53/95  soft-tissue]
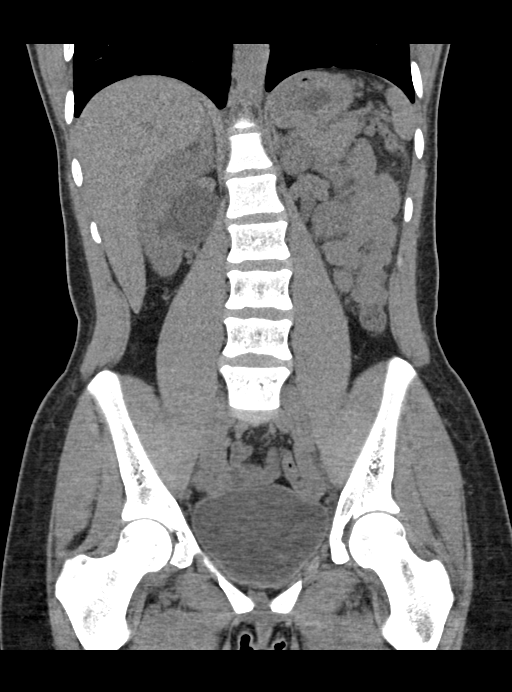

[16 of 46 positions shown; findings below may reference images not displayed]

FINDINGS: Lower chest: No acute abnormality.

Hepatobiliary: Gallbladder is well distended with multiple
gallstones within similar to that seen on prior ultrasound. No
hepatic abnormality is seen. No biliary ductal dilatation is noted.

Pancreas: Unremarkable. No pancreatic ductal dilatation or
surrounding inflammatory changes.

Spleen: Normal in size without focal abnormality.

Adrenals/Urinary Tract: The adrenal glands are within normal limits.
The left kidney is unremarkable. The right kidney demonstrates
significant hydronephrosis and proximal hydroureter. The ureter
distally is not dilated. The bladder is within normal limits. These
changes likely represent a congenital UPJ obstruction.

Stomach/Bowel: No obstructive or inflammatory changes are
identified. The appendix is within normal limits. No changes to
suggest appendicitis are seen.

Vascular/Lymphatic: No significant vascular findings are present. No
enlarged abdominal or pelvic lymph nodes.

Reproductive: Uterus and bilateral adnexa are unremarkable.

Other: No abdominal wall hernia or abnormality. No abdominopelvic
ascites.

Musculoskeletal: No acute or significant osseous findings.
IMPRESSION: Cholelithiasis without evidence of biliary ductal dilatation.

Dilatation of the right renal collecting system to the level of the
ureteropelvic junction. No obstructing stone is seen. The distal
right ureter is within normal limits. These changes likely represent
a congenital UPJ obstruction.
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
